# Patient Record
Sex: Female | Born: 1965 | Race: White | Hispanic: No | Marital: Married
Health system: Southern US, Community
[De-identification: ages and names within clinical notes are randomized; demographics above are authoritative.]

---

## 2011-09-05 ENCOUNTER — Other Ambulatory Visit: Payer: Self-pay

## 2011-09-05 DIAGNOSIS — R0602 Shortness of breath: Secondary | ICD-10-CM

## 2011-09-13 ENCOUNTER — Ambulatory Visit (HOSPITAL_COMMUNITY)
Admission: RE | Admit: 2011-09-13 | Discharge: 2011-09-13 | Disposition: A | Discharge status: Home / Self Care | Payer: PRIVATE HEALTH INSURANCE | Source: Ambulatory Visit | Attending: Pulmonary Disease | Admitting: Pulmonary Disease

## 2011-09-13 DIAGNOSIS — R0602 Shortness of breath: Secondary | ICD-10-CM | POA: Insufficient documentation

## 2011-09-13 MED ORDER — ALBUTEROL SULFATE (5 MG/ML) 0.5% IN NEBU: 2.5000 mg | INHALATION_SOLUTION | Freq: Once | RESPIRATORY_TRACT | Status: DC

## 2011-09-14 NOTE — Procedures (Signed)
NAME:  Amanda Norton, Amanda Norton            ACCOUNT NO.:  [id number]  MEDICAL RECORD NO.:  [id number]  LOCATION:  RESP                          FACILITY:  [facility]  PHYSICIAN:  Cheria Sadiq L. Juanetta Gosling, M.D.DATE OF BIRTH:  Sep 02, 1965  DATE OF PROCEDURE: DATE OF DISCHARGE:  09/13/2011                           PULMONARY FUNCTION TEST   Reason for pulmonary function testing is shortness of breath. 1. Spirometry is normal. 2. Lung volume is normal. 3. DLCO is normal. 4. Airway resistance is normal. 5. There is no significant bronchodilator improvement. 6. This is a normal pulmonary function test.     Ramon Dredge L. Juanetta Gosling, M.D.     [facility]  D:  09/14/2011  T:  09/14/2011  Job:  [id number]

## 2011-09-19 LAB — PULMONARY FUNCTION TEST

## 2013-04-17 ENCOUNTER — Ambulatory Visit (HOSPITAL_COMMUNITY)
Admission: RE | Admit: 2013-04-17 | Discharge: 2013-04-17 | Disposition: A | Discharge status: Home / Self Care | Payer: No Typology Code available for payment source | Source: Ambulatory Visit | Attending: Pulmonary Disease | Admitting: Pulmonary Disease

## 2013-04-17 ENCOUNTER — Other Ambulatory Visit (HOSPITAL_COMMUNITY): Payer: Self-pay | Admitting: Pulmonary Disease

## 2013-04-17 DIAGNOSIS — IMO0002 Reserved for concepts with insufficient information to code with codable children: Secondary | ICD-10-CM | POA: Insufficient documentation

## 2013-04-17 DIAGNOSIS — M542 Cervicalgia: Secondary | ICD-10-CM | POA: Insufficient documentation
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
## 2015-04-30 ENCOUNTER — Encounter (INDEPENDENT_AMBULATORY_CARE_PROVIDER_SITE_OTHER): Payer: Self-pay | Admitting: *Deleted

## 2022-11-09 ENCOUNTER — Other Ambulatory Visit (HOSPITAL_COMMUNITY): Payer: Self-pay | Admitting: Family Medicine

## 2022-11-09 DIAGNOSIS — Z1231 Encounter for screening mammogram for malignant neoplasm of breast: Secondary | ICD-10-CM

## 2022-11-13 ENCOUNTER — Encounter: Payer: Self-pay | Admitting: *Deleted

## 2023-05-16 ENCOUNTER — Encounter: Payer: Self-pay | Admitting: *Deleted

## 2024-06-19 ENCOUNTER — Ambulatory Visit: Admitting: Rheumatology

## 2024-07-30 ENCOUNTER — Ambulatory Visit: Admitting: Rheumatology
